# Patient Record
Sex: Female | Born: 1935 | Race: Black or African American | Hispanic: No
Health system: Southern US, Community
[De-identification: ages and names within clinical notes are randomized; demographics above are authoritative.]

---

## 2007-06-10 ENCOUNTER — Inpatient Hospital Stay (HOSPITAL_COMMUNITY): Admission: EM | Admit: 2007-06-10 | Discharge: 2007-06-17 | Payer: Self-pay | Admitting: Emergency Medicine

## 2007-06-10 ENCOUNTER — Ambulatory Visit: Payer: Self-pay | Admitting: Cardiology

## 2007-06-11 ENCOUNTER — Encounter (INDEPENDENT_AMBULATORY_CARE_PROVIDER_SITE_OTHER): Payer: Self-pay | Admitting: *Deleted

## 2007-06-11 ENCOUNTER — Ambulatory Visit: Payer: Self-pay | Admitting: Surgery

## 2007-08-05 ENCOUNTER — Other Ambulatory Visit: Payer: Self-pay

## 2007-08-05 ENCOUNTER — Emergency Department: Payer: Self-pay | Admitting: Unknown Physician Specialty

## 2007-11-09 ENCOUNTER — Emergency Department: Payer: Self-pay | Admitting: Emergency Medicine

## 2007-11-10 ENCOUNTER — Other Ambulatory Visit: Payer: Self-pay

## 2010-08-08 NOTE — Discharge Summary (Signed)
NAMEJANELYS, Heather Burgess               ACCOUNT NO.:  [id number]   MEDICAL RECORD NO.:  [id number]          PATIENT TYPE:  INP   LOCATION:  5504                         FACILITY:  [facility]   PHYSICIAN:  Wilson Singer, M.D.DATE OF BIRTH:  12-Mar-1925   DATE OF ADMISSION:  06/10/2007  DATE OF DISCHARGE:                               DISCHARGE SUMMARY   FINAL DISCHARGE DIAGNOSES:  1. Cerebrovascular disease, likely multi-infarct dementia.  2. Syncope, unclear etiology.  3. Depression.   MEDICATIONS ON DISCHARGE:  1. Paxil CR 25 mg daily.  2. Claritin 10 mg daily.  3. Multivitamins 1 tablet daily.  4. Protonix 40 mg daily.  5. Aspirin 325 mg daily.  6. Aricept 5 mg nightly.  7. Potassium chloride 20 mEq daily.  8. Tylenol 650 mg every 4 hours p.r.n.   CONDITION ON DISCHARGE:  Stable.   HISTORY:  This is an 77 year old lady who was admitted for a syncopal  episode.  Please see initial history and physical examination done by  Dr. Michaelyn Barter.   HOSPITAL PROGRESS:  The patient was admitted and the etiology of her  syncopal episode was not entirely clear clinically.  She underwent  bilateral carotid artery Dopplers, which were negative for any stenosis.  She also underwent echocardiogram, which did not reveal any  thromboembolic problems.  Her ejection fraction was 60-65% with mild  diastolic dysfunction.  She had an MRI of the brain, which showed 1 to 2  mm acute white matter infarctions probable in the right parietal and  left frontal lobes.  There was no large acute infarction.  The report  was questioning whether these were infarcts, but on a clinical basis,  they probably were.  During the course of the next few days, she has  stabilized and she has been reviewed by physical and occupational  therapy, who feel that, in terms of stability and safety, that she would  be better served being in a skilled nursing facility for a short-term  versus home health care.  The family  also wished for her to be in a  nursing home to improve her stability.  Now, we are awaiting a nursing  home bed to be found prior to discharge.   Further disposition once she has proven to be stable without any further  falls or syncopal episodes.  In the skilled nursing facility, the desire  would be for the patient to return in her own home to live with her son  and daughter-in-law.      Wilson Singer, M.D.  Electronically Signed     NCG/MEDQ  D:  06/16/2007  T:  06/16/2007  Job:  [id number]

## 2010-08-08 NOTE — H&P (Signed)
Heather Burgess, Heather Burgess NO.:  [id number]   MEDICAL RECORD NO.:  [id number]          PATIENT TYPE:  INP   LOCATION:  1825                         FACILITY:  [facility]   PHYSICIAN:  Michaelyn Barter, M.D. DATE OF BIRTH:  08/17/1924   DATE OF ADMISSION:  06/10/2007  DATE OF DISCHARGE:                              HISTORY & PHYSICAL   CHIEF COMPLAINT:  I fainted.   PRIMARY CARE PHYSICIAN:  Unassigned.   HISTORY OF PRESENT ILLNESS:  Heather Burgess is an 77 year old female who was  not sure of what happened to her.  She states that she passed out but  cannot recall the events that surrounded the episode. She has been  having what she describes as a faint headache over the last few days  that is usually localized to the right parietal area. Advil helps to  alleviate her symptoms. Her daughter-in-law, Heather Burgess, is also  at the bedside.  She states that the patient was asleep earlier this  morning on the second floor.  She herself was on the first floor.  She  heard a loud thud and ran upstairs to check on the patient. It appeared  that the patient have fallen out of bed. The patient was disoriented and  had difficulty talking shortly afterwards. The patient could not tell  her daughter what has happened, however, she complained of feeling  nauseated.  There have been no fevers or chills.  No shortness of  breath.  No chest pain.   PAST MEDICAL HISTORY:  Depression.   PAST SURGICAL HISTORY:  Left breast lumpectomy 10 years ago that was  benign.   ALLERGIES:  No known drug allergies.   HOME MEDICATIONS.1:  1. Hematinic Plus tablets once a daily.  2. Paxil CR 25 mg p.o. daily.  3. Evista 60 mg daily.  4. Zyrtec 10 mg p.o. daily.   SOCIAL HISTORY:  The patient stopped smoking cigarettes numerous years  ago.  Alcohol:  She stopped consuming alcohol at least 20 years ago.   FAMILY HISTORY:  Mother's medical history is unknown.  Father's medical  history is  unknown.   PHYSICAL EXAMINATION:  GENERAL:  The patient is awake.  She is  cooperative.  She is in no obvious respiratory distress.  VITAL SIGNS:  Temperature 97.8, blood pressure 142/75, heart rate 69,  respirations 14, O2 saturation 98%.  HEENT:  Normocephalic.  The patient has a small contusion on the lateral  aspect of her face just lateral to the left eyebrow.  She also has a  contusion on her left jaw. Oral mucosa is pink.  No thrush.  Dentures  are present within the upper and lower regions of the patient's mouth.  NECK:  Supple.  No JVD, no lymphadenopathy.  CARDIAC:  S1-S2 present.  Regular rate and rhythm.  RESPIRATORY:  No crackles or wheezes.  ABDOMEN:  Soft, some vague generalized discomfort.  No masses palpated.  Positive bowel sounds x4 quadrants.  EXTREMITIES:  No leg edema.  NEUROLOGIC:  The patient is alert and oriented x3.  Cranial nerves II-  XII are  intact.  MUSCULOSKELETAL:  5/5 upper and lower extremity strength.  There are no  obvious focal motor deficits.  There is no facial droop.  Negative  pronator drift.  Grip strength is symmetrically equal.   A chest x-ray reveals no acute findings.   CT scan of the patient's head also reveals no acute findings.   The patient's UA reveals 11-20 white blood cells, few bacteria, large  leukocytes, 5-6 wbc's.  The patient's white blood cell count is 5.6,  hemoglobin 14.4, hematocrit 43.0, platelets 192.  CK-MB at point of care  was less than 1.  Troponin I at point of care less than 0.05. Creatinine  1.2.  The pH was 7.378, pCO2 42.5, bicarb 25.0.  Sodium 143, potassium  3.7, chloride 111, glucose 84, BUN 16, creatinine 1.2.   ASSESSMENT AND PLAN:  1. Syncope. Whether or not this represents a true syncopal episode is      questionable. The patient's description of what led up to the      occurrence is very vague.  The patient does state that she passed      out.  However, whether or not the patient actually rolled out  of      bed and fell onto the floor versus having a true syncopal episode      is questionable.  Will look for cardiac versus noncardiac factors      that may have contributed to the patient's syncopal episode.  Will      check carotids and 2-D echocardiogram. Will cycle the patient's      cardiac markers. Will admit the patient for 23-hour observation.  2. Urinary tract infection.  Will start the patient on empiric IV      antibiotics.  3. Headache. The etiology of the patient's headache is questionable.      Whether or not this contributed to the syncopal episode is also      questionable. Will treat the patient symptomatically for now.  4. Gastrointestinal prophylaxis. Will provide Protonix.  5. Deep vein thrombosis prophylaxis.  Will provide Lovenox.      Michaelyn Barter, M.D.  Electronically Signed     OR/MEDQ  D:  06/10/2007  T:  06/10/2007  Job:  [id number]

## 2010-12-18 LAB — LIPID PANEL
Cholesterol: 194
HDL: 67
Triglycerides: 67

## 2010-12-18 LAB — URINE CULTURE
Colony Count: NO GROWTH
Culture: NO GROWTH

## 2010-12-18 LAB — COMPREHENSIVE METABOLIC PANEL
ALT: 16
ALT: 16
AST: 23
Albumin: 3.2 — ABNORMAL LOW
Alkaline Phosphatase: 102
CO2: 23
Chloride: 108
GFR calc Af Amer: 55 — ABNORMAL LOW
GFR calc non Af Amer: 46 — ABNORMAL LOW
Potassium: 3.3 — ABNORMAL LOW
Potassium: 3.3 — ABNORMAL LOW
Sodium: 137
Sodium: 139
Total Bilirubin: 0.9
Total Protein: 7.7

## 2010-12-18 LAB — CBC
HCT: 41.8
HCT: 43
Hemoglobin: 14.4
MCHC: 33.4
Platelets: 199
Platelets: 207
RBC: 4.86
RDW: 14
WBC: 6.3

## 2010-12-18 LAB — BASIC METABOLIC PANEL
BUN: 12
CO2: 25
Calcium: 8.8
Creatinine, Ser: 1.31 — ABNORMAL HIGH
GFR calc non Af Amer: 49 — ABNORMAL LOW
Glucose, Bld: 79
Potassium: 3.5
Sodium: 140

## 2010-12-18 LAB — DIFFERENTIAL
Eosinophils Relative: 2
Lymphocytes Relative: 32
Monocytes Absolute: 0.3
Monocytes Relative: 6
Neutro Abs: 3.4

## 2010-12-18 LAB — URINALYSIS, ROUTINE W REFLEX MICROSCOPIC
Bilirubin Urine: NEGATIVE
Hgb urine dipstick: NEGATIVE
Ketones, ur: NEGATIVE
Protein, ur: NEGATIVE
Urobilinogen, UA: 1

## 2010-12-18 LAB — I-STAT 8, (EC8 V) (CONVERTED LAB)
Chloride: 111
HCT: 47 — ABNORMAL HIGH
Hemoglobin: 16 — ABNORMAL HIGH
Potassium: 3.7
Sodium: 143
TCO2: 26

## 2010-12-18 LAB — CARDIAC PANEL(CRET KIN+CKTOT+MB+TROPI)
CK, MB: 0.9
Total CK: 53

## 2010-12-18 LAB — TROPONIN I: Troponin I: 0.03

## 2010-12-18 LAB — POCT I-STAT CREATININE: Operator id: 294521

## 2010-12-18 LAB — POCT CARDIAC MARKERS
CKMB, poc: <1 — ABNORMAL LOW
Troponin i, poc: <0.05

## 2010-12-18 LAB — URINE MICROSCOPIC-ADD ON

## 2011-02-08 ENCOUNTER — Emergency Department: Payer: Self-pay | Admitting: *Deleted

## 2011-09-26 ENCOUNTER — Emergency Department: Payer: Self-pay

## 2011-09-26 LAB — URINALYSIS, COMPLETE
Leukocyte Esterase: NEGATIVE
Nitrite: NEGATIVE
Ph: 7 (ref 4.5–8.0)
Protein: 30
RBC,UR: <1 /HPF (ref 0–5)

## 2011-09-26 LAB — COMPREHENSIVE METABOLIC PANEL
Albumin: 3.4 g/dL (ref 3.4–5.0)
Anion Gap: 6 — ABNORMAL LOW (ref 7–16)
Bilirubin,Total: 0.3 mg/dL (ref 0.2–1.0)
Calcium, Total: 9 mg/dL (ref 8.5–10.1)
Creatinine: 1.66 mg/dL — ABNORMAL HIGH (ref 0.60–1.30)
Glucose: 86 mg/dL (ref 65–99)
Osmolality: 284 (ref 275–301)
Potassium: 3.8 mmol/L (ref 3.5–5.1)
Sodium: 139 mmol/L (ref 136–145)
Total Protein: 8.4 g/dL — ABNORMAL HIGH (ref 6.4–8.2)

## 2011-09-26 LAB — CBC
HCT: 36.7 % (ref 35.0–47.0)
HGB: 11.9 g/dL — ABNORMAL LOW (ref 12.0–16.0)
MCH: 28.1 pg (ref 26.0–34.0)
MCHC: 32.4 g/dL (ref 32.0–36.0)
MCV: 87 fL (ref 80–100)
RBC: 4.24 10*6/uL (ref 3.80–5.20)

## 2011-11-08 ENCOUNTER — Emergency Department: Payer: Self-pay | Admitting: Emergency Medicine

## 2011-11-08 LAB — CBC
HCT: 35.7 % (ref 35.0–47.0)
MCH: 27.4 pg (ref 26.0–34.0)
MCV: 85 fL (ref 80–100)
Platelet: 308 10*3/uL (ref 150–440)
RDW: 14.5 % (ref 11.5–14.5)
WBC: 8.6 10*3/uL (ref 3.6–11.0)

## 2011-11-08 LAB — URINALYSIS, COMPLETE
Bilirubin,UR: NEGATIVE
Glucose,UR: NEGATIVE mg/dL (ref 0–75)
Ketone: NEGATIVE
RBC,UR: 1 /HPF (ref 0–5)

## 2011-11-08 LAB — COMPREHENSIVE METABOLIC PANEL
Alkaline Phosphatase: 101 U/L (ref 50–136)
Anion Gap: 6 — ABNORMAL LOW (ref 7–16)
Calcium, Total: 9.3 mg/dL (ref 8.5–10.1)
Co2: 27 mmol/L (ref 21–32)
EGFR (Non-African Amer.): 34 — ABNORMAL LOW
Glucose: 90 mg/dL (ref 65–99)
Osmolality: 284 (ref 275–301)
SGOT(AST): 27 U/L (ref 15–37)
Sodium: 139 mmol/L (ref 136–145)

## 2011-11-08 LAB — CK TOTAL AND CKMB (NOT AT ARMC): CK, Total: 41 U/L (ref 21–215)

## 2011-11-25 ENCOUNTER — Inpatient Hospital Stay: Payer: Self-pay | Admitting: Student

## 2011-11-25 LAB — URINALYSIS, COMPLETE
Bacteria: NONE SEEN
Bilirubin,UR: NEGATIVE
Glucose,UR: NEGATIVE mg/dL (ref 0–75)
Granular Cast: 3
Ketone: NEGATIVE
Nitrite: NEGATIVE
Specific Gravity: 1.014 (ref 1.003–1.030)
Squamous Epithelial: 1
WBC UR: 1 /HPF (ref 0–5)

## 2011-11-25 LAB — COMPREHENSIVE METABOLIC PANEL
Anion Gap: 12 (ref 7–16)
BUN: 29 mg/dL — ABNORMAL HIGH (ref 7–18)
Calcium, Total: 8.9 mg/dL (ref 8.5–10.1)
Chloride: 110 mmol/L — ABNORMAL HIGH (ref 98–107)
Co2: 21 mmol/L (ref 21–32)
EGFR (African American): 35 — ABNORMAL LOW
EGFR (Non-African Amer.): 30 — ABNORMAL LOW
Potassium: 3.4 mmol/L — ABNORMAL LOW (ref 3.5–5.1)
SGOT(AST): 30 U/L (ref 15–37)
SGPT (ALT): 18 U/L (ref 12–78)
Total Protein: 8.4 g/dL — ABNORMAL HIGH (ref 6.4–8.2)

## 2011-11-25 LAB — TROPONIN I: Troponin-I: 0.05 ng/mL

## 2011-11-25 LAB — CBC
Platelet: 294 10*3/uL (ref 150–440)
RDW: 14.5 % (ref 11.5–14.5)
WBC: 7.5 10*3/uL (ref 3.6–11.0)

## 2011-11-25 LAB — LIPASE, BLOOD: Lipase: 270 U/L (ref 73–393)

## 2011-11-26 LAB — BASIC METABOLIC PANEL
Calcium, Total: 8.9 mg/dL (ref 8.5–10.1)
Co2: 26 mmol/L (ref 21–32)
EGFR (African American): 43 — ABNORMAL LOW
Osmolality: 287 (ref 275–301)
Sodium: 142 mmol/L (ref 136–145)

## 2011-11-26 LAB — MAGNESIUM: Magnesium: 2 mg/dL

## 2011-11-26 LAB — LIPID PANEL
HDL Cholesterol: 65 mg/dL — ABNORMAL HIGH (ref 40–60)
Triglycerides: 91 mg/dL (ref 0–200)

## 2012-04-27 ENCOUNTER — Inpatient Hospital Stay: Payer: Self-pay | Admitting: Internal Medicine

## 2012-04-27 LAB — URINALYSIS, COMPLETE
Bacteria: NONE SEEN
Bilirubin,UR: NEGATIVE
Blood: NEGATIVE
Glucose,UR: NEGATIVE mg/dL (ref 0–75)
Ketone: NEGATIVE
Leukocyte Esterase: NEGATIVE
Nitrite: NEGATIVE
Protein: NEGATIVE
Specific Gravity: 1.01 (ref 1.003–1.030)
Squamous Epithelial: NONE SEEN
WBC UR: <1 /HPF (ref 0–5)

## 2012-04-27 LAB — COMPREHENSIVE METABOLIC PANEL
Albumin: 3.2 g/dL — ABNORMAL LOW (ref 3.4–5.0)
Alkaline Phosphatase: 129 U/L (ref 50–136)
Anion Gap: 9 (ref 7–16)
Calcium, Total: 8.6 mg/dL (ref 8.5–10.1)
Chloride: 108 mmol/L — ABNORMAL HIGH (ref 98–107)
Co2: 23 mmol/L (ref 21–32)
Osmolality: 302 (ref 275–301)
Potassium: 3.9 mmol/L (ref 3.5–5.1)
SGOT(AST): 28 U/L (ref 15–37)
SGPT (ALT): 22 U/L (ref 12–78)
Sodium: 140 mmol/L (ref 136–145)
Total Protein: 8.5 g/dL — ABNORMAL HIGH (ref 6.4–8.2)

## 2012-04-27 LAB — TROPONIN I
Troponin-I: 0.06 ng/mL — ABNORMAL HIGH
Troponin-I: 0.05 ng/mL

## 2012-04-27 LAB — CBC WITH DIFFERENTIAL/PLATELET
Basophil #: 0 10*3/uL (ref 0.0–0.1)
Eosinophil %: 3.2 %
HCT: 28.5 % — ABNORMAL LOW (ref 35.0–47.0)
HGB: 8.9 g/dL — ABNORMAL LOW (ref 12.0–16.0)
Lymphocyte #: 2.1 10*3/uL (ref 1.0–3.6)
Lymphocyte %: 35.3 %
MCHC: 31.4 g/dL — ABNORMAL LOW (ref 32.0–36.0)
Monocyte %: 3.2 %
WBC: 6 10*3/uL (ref 3.6–11.0)

## 2012-04-27 LAB — MAGNESIUM: Magnesium: 2.2 mg/dL

## 2012-04-28 LAB — BASIC METABOLIC PANEL
Chloride: 105 mmol/L (ref 98–107)
Co2: 25 mmol/L (ref 21–32)
Creatinine: 1.14 mg/dL (ref 0.60–1.30)
EGFR (Non-African Amer.): 44 — ABNORMAL LOW
Potassium: 4 mmol/L (ref 3.5–5.1)
Sodium: 139 mmol/L (ref 136–145)

## 2012-04-28 LAB — CBC WITH DIFFERENTIAL/PLATELET
Basophil #: 0 10*3/uL (ref 0.0–0.1)
Eosinophil #: 0.4 10*3/uL (ref 0.0–0.7)
Lymphocyte %: 26 %
MCV: 81 fL (ref 80–100)
Monocyte #: 0.3 x10 3/mm (ref 0.2–0.9)
Neutrophil #: 4.8 10*3/uL (ref 1.4–6.5)
RDW: 15.8 % — ABNORMAL HIGH (ref 11.5–14.5)
WBC: 7.6 10*3/uL (ref 3.6–11.0)

## 2012-05-03 LAB — CULTURE, BLOOD (SINGLE)

## 2012-06-29 ENCOUNTER — Emergency Department: Payer: Self-pay | Admitting: Emergency Medicine

## 2012-06-29 LAB — CBC
MCHC: 31.5 g/dL — ABNORMAL LOW (ref 32.0–36.0)
MCV: 79 fL — ABNORMAL LOW (ref 80–100)
RBC: 4.7 10*6/uL (ref 3.80–5.20)
RDW: 16.7 % — ABNORMAL HIGH (ref 11.5–14.5)
WBC: 8.1 10*3/uL (ref 3.6–11.0)

## 2012-06-29 LAB — COMPREHENSIVE METABOLIC PANEL
Alkaline Phosphatase: 121 U/L (ref 50–136)
Anion Gap: 2 — ABNORMAL LOW (ref 7–16)
Bilirubin,Total: 0.4 mg/dL (ref 0.2–1.0)
Calcium, Total: 9 mg/dL (ref 8.5–10.1)
Chloride: 108 mmol/L — ABNORMAL HIGH (ref 98–107)
Sodium: 138 mmol/L (ref 136–145)

## 2013-03-07 ENCOUNTER — Inpatient Hospital Stay: Payer: Self-pay | Admitting: Internal Medicine

## 2013-03-07 LAB — URINALYSIS, COMPLETE
Glucose,UR: NEGATIVE mg/dL (ref 0–75)
Granular Cast: 7
Hyaline Cast: 15
Ph: 5 (ref 4.5–8.0)
RBC,UR: 4 /HPF (ref 0–5)
Specific Gravity: 1.028 (ref 1.003–1.030)
Squamous Epithelial: 1
WBC UR: 10 /HPF (ref 0–5)

## 2013-03-07 LAB — CBC
HCT: 34.5 % — ABNORMAL LOW (ref 35.0–47.0)
HGB: 10.7 g/dL — ABNORMAL LOW (ref 12.0–16.0)
Platelet: 262 10*3/uL (ref 150–440)
RBC: 4.48 10*6/uL (ref 3.80–5.20)

## 2013-03-07 LAB — COMPREHENSIVE METABOLIC PANEL
Calcium, Total: 8.9 mg/dL (ref 8.5–10.1)
Chloride: 109 mmol/L — ABNORMAL HIGH (ref 98–107)
Co2: 24 mmol/L (ref 21–32)
Creatinine: 2.21 mg/dL — ABNORMAL HIGH (ref 0.60–1.30)
EGFR (Non-African Amer.): 20 — ABNORMAL LOW
Glucose: 110 mg/dL — ABNORMAL HIGH (ref 65–99)
SGOT(AST): 34 U/L (ref 15–37)
Sodium: 138 mmol/L (ref 136–145)

## 2013-03-08 LAB — BASIC METABOLIC PANEL
BUN: 35 mg/dL — ABNORMAL HIGH (ref 7–18)
Chloride: 113 mmol/L — ABNORMAL HIGH (ref 98–107)
Co2: 21 mmol/L (ref 21–32)
EGFR (Non-African Amer.): 21 — ABNORMAL LOW
Osmolality: 287 (ref 275–301)

## 2013-03-08 LAB — CBC WITH DIFFERENTIAL/PLATELET
Basophil #: 0.1 10*3/uL (ref 0.0–0.1)
HGB: 10.7 g/dL — ABNORMAL LOW (ref 12.0–16.0)
Lymphocyte #: 1.6 10*3/uL (ref 1.0–3.6)
MCH: 24.9 pg — ABNORMAL LOW (ref 26.0–34.0)
MCHC: 31.8 g/dL — ABNORMAL LOW (ref 32.0–36.0)
MCV: 78 fL — ABNORMAL LOW (ref 80–100)
RBC: 4.31 10*6/uL (ref 3.80–5.20)
WBC: 9.4 10*3/uL (ref 3.6–11.0)

## 2013-03-10 LAB — BASIC METABOLIC PANEL
Co2: 24 mmol/L (ref 21–32)
EGFR (African American): 43 — ABNORMAL LOW
EGFR (Non-African Amer.): 37 — ABNORMAL LOW
Sodium: 139 mmol/L (ref 136–145)

## 2013-03-12 LAB — CULTURE, BLOOD (SINGLE)

## 2013-03-26 ENCOUNTER — Ambulatory Visit: Payer: Self-pay | Admitting: Nurse Practitioner

## 2013-04-23 ENCOUNTER — Observation Stay: Payer: Self-pay | Admitting: Specialist

## 2013-04-23 LAB — COMPREHENSIVE METABOLIC PANEL
ALBUMIN: 3.3 g/dL — AB (ref 3.4–5.0)
ALK PHOS: 114 U/L
AST: 53 U/L — AB (ref 15–37)
Anion Gap: 6 — ABNORMAL LOW (ref 7–16)
BILIRUBIN TOTAL: 0.2 mg/dL (ref 0.2–1.0)
BUN: 26 mg/dL — ABNORMAL HIGH (ref 7–18)
CALCIUM: 9.5 mg/dL (ref 8.5–10.1)
CO2: 20 mmol/L — AB (ref 21–32)
Chloride: 111 mmol/L — ABNORMAL HIGH (ref 98–107)
Creatinine: 1.72 mg/dL — ABNORMAL HIGH (ref 0.60–1.30)
EGFR (African American): 31 — ABNORMAL LOW
GFR CALC NON AF AMER: 26 — AB
Glucose: 68 mg/dL (ref 65–99)
Osmolality: 277 (ref 275–301)
Potassium: 4.8 mmol/L (ref 3.5–5.1)
SGPT (ALT): 27 U/L (ref 12–78)
Sodium: 137 mmol/L (ref 136–145)
TOTAL PROTEIN: 9.3 g/dL — AB (ref 6.4–8.2)

## 2013-04-23 LAB — URINALYSIS, COMPLETE
BILIRUBIN, UR: NEGATIVE
BLOOD: NEGATIVE
Glucose,UR: NEGATIVE mg/dL (ref 0–75)
Ketone: NEGATIVE
Nitrite: NEGATIVE
PH: 5 (ref 4.5–8.0)
Protein: 30
RBC,UR: 2 /HPF (ref 0–5)
Specific Gravity: 1.026 (ref 1.003–1.030)
WBC UR: 22 /HPF (ref 0–5)

## 2013-04-23 LAB — TROPONIN I
Troponin-I: 0.06 ng/mL — ABNORMAL HIGH
Troponin-I: 0.07 ng/mL — ABNORMAL HIGH
Troponin-I: 0.06 ng/mL — ABNORMAL HIGH
Troponin-I: 0.06 ng/mL — ABNORMAL HIGH

## 2013-04-23 LAB — CBC
HCT: 34.8 % — ABNORMAL LOW (ref 35.0–47.0)
HGB: 11.2 g/dL — AB (ref 12.0–16.0)
MCH: 25.7 pg — ABNORMAL LOW (ref 26.0–34.0)
MCHC: 32.1 g/dL (ref 32.0–36.0)
MCV: 80 fL (ref 80–100)
Platelet: 243 10*3/uL (ref 150–440)
RBC: 4.35 10*6/uL (ref 3.80–5.20)
RDW: 19.5 % — AB (ref 11.5–14.5)
WBC: 7.9 10*3/uL (ref 3.6–11.0)

## 2013-04-23 LAB — LIPASE, BLOOD: Lipase: 291 U/L (ref 73–393)

## 2013-04-23 LAB — CK TOTAL AND CKMB (NOT AT ARMC)
CK, TOTAL: 32 U/L (ref 21–215)
CK, Total: 26 U/L (ref 21–215)
CK, Total: 33 U/L (ref 21–215)
CK-MB: 1 ng/mL (ref 0.5–3.6)
CK-MB: 0.9 ng/mL (ref 0.5–3.6)
CK-MB: 0.8 ng/mL (ref 0.5–3.6)

## 2013-04-24 DIAGNOSIS — I059 Rheumatic mitral valve disease, unspecified: Secondary | ICD-10-CM

## 2013-04-24 LAB — URINE CULTURE

## 2013-04-24 LAB — BASIC METABOLIC PANEL
Anion Gap: 5 — ABNORMAL LOW (ref 7–16)
BUN: 16 mg/dL (ref 7–18)
CALCIUM: 9.1 mg/dL (ref 8.5–10.1)
CO2: 21 mmol/L (ref 21–32)
Chloride: 112 mmol/L — ABNORMAL HIGH (ref 98–107)
Creatinine: 1.5 mg/dL — ABNORMAL HIGH (ref 0.60–1.30)
EGFR (Non-African Amer.): 31 — ABNORMAL LOW
GFR CALC AF AMER: 36 — AB
GLUCOSE: 76 mg/dL (ref 65–99)
Osmolality: 276 (ref 275–301)
POTASSIUM: 4.1 mmol/L (ref 3.5–5.1)
SODIUM: 138 mmol/L (ref 136–145)

## 2013-04-24 LAB — CBC WITH DIFFERENTIAL/PLATELET
Basophil #: 0 10*3/uL (ref 0.0–0.1)
Basophil %: 0.6 %
EOS ABS: 0.1 10*3/uL (ref 0.0–0.7)
EOS PCT: 1.8 %
HCT: 33.7 % — ABNORMAL LOW (ref 35.0–47.0)
HGB: 10.9 g/dL — AB (ref 12.0–16.0)
Lymphocyte #: 2.5 10*3/uL (ref 1.0–3.6)
Lymphocyte %: 35.7 %
MCH: 25.6 pg — ABNORMAL LOW (ref 26.0–34.0)
MCHC: 32.5 g/dL (ref 32.0–36.0)
MCV: 79 fL — AB (ref 80–100)
MONOS PCT: 4.4 %
Monocyte #: 0.3 x10 3/mm (ref 0.2–0.9)
NEUTROS PCT: 57.5 %
Neutrophil #: 4 10*3/uL (ref 1.4–6.5)
Platelet: 252 10*3/uL (ref 150–440)
RBC: 4.28 10*6/uL (ref 3.80–5.20)
RDW: 19.6 % — ABNORMAL HIGH (ref 11.5–14.5)
WBC: 7 10*3/uL (ref 3.6–11.0)

## 2013-04-26 LAB — BASIC METABOLIC PANEL
ANION GAP: 8 (ref 7–16)
BUN: 12 mg/dL (ref 7–18)
CALCIUM: 8.8 mg/dL (ref 8.5–10.1)
CO2: 20 mmol/L — AB (ref 21–32)
Chloride: 112 mmol/L — ABNORMAL HIGH (ref 98–107)
Creatinine: 1.23 mg/dL (ref 0.60–1.30)
EGFR (African American): 46 — ABNORMAL LOW
EGFR (Non-African Amer.): 40 — ABNORMAL LOW
Glucose: 65 mg/dL (ref 65–99)
OSMOLALITY: 277 (ref 275–301)
Potassium: 3.6 mmol/L (ref 3.5–5.1)
Sodium: 140 mmol/L (ref 136–145)

## 2014-04-26 DEATH — deceased

## 2014-05-19 IMAGING — CT CT HEAD WITHOUT CONTRAST
3 series · 17 of 30 positions shown, 19 images · non-contrast
Comparison: CT HEAD W/O CM dated 06/29/2012; CT HEAD W/O CM dated
11/25/2011

CLINICAL DATA: Unresponsive.

EXAM:
CT HEAD WITHOUT CONTRAST
TECHNIQUE: Contiguous axial images were obtained from the base of the skull
through the vertex without intravenous contrast.

[Series 2: soft tissue · axial · 0.37mm/px · z∈[+338,+454]mm · 7 of 31 slices shown (1 of 2)]
[im 4/31  brain]
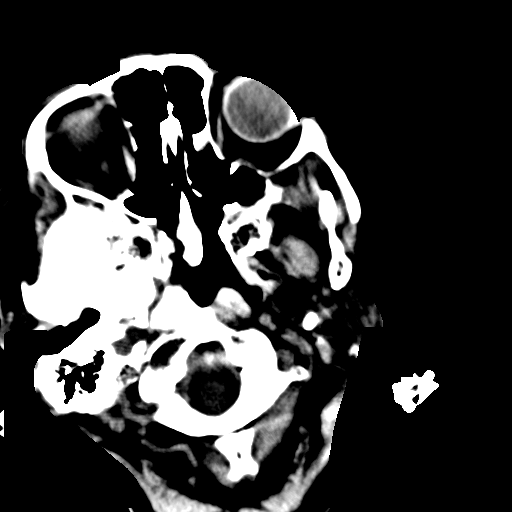
[im 8/31  brain]
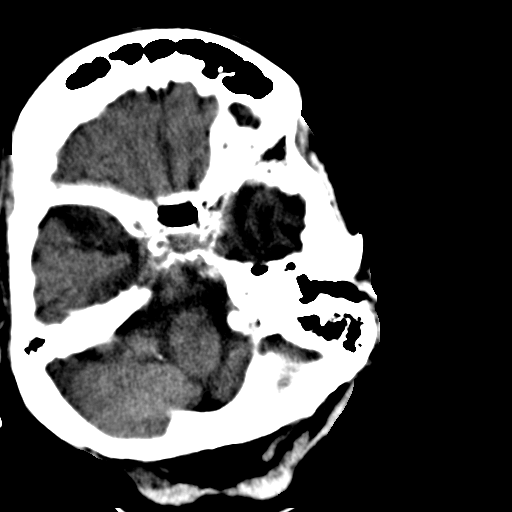
[im 12/31  brain]
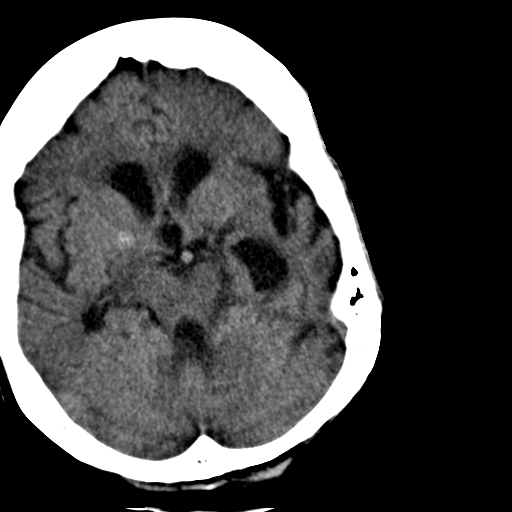
[im 16/31  brain]
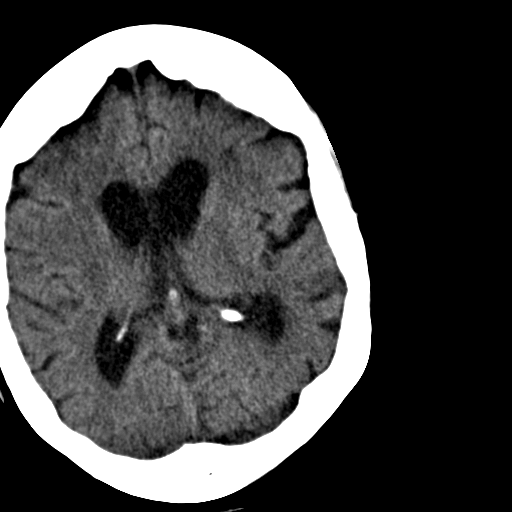
[im 19/31  brain]
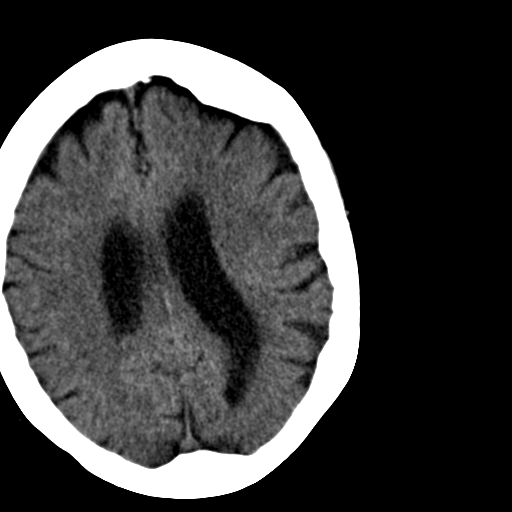
[im 23/31  brain]
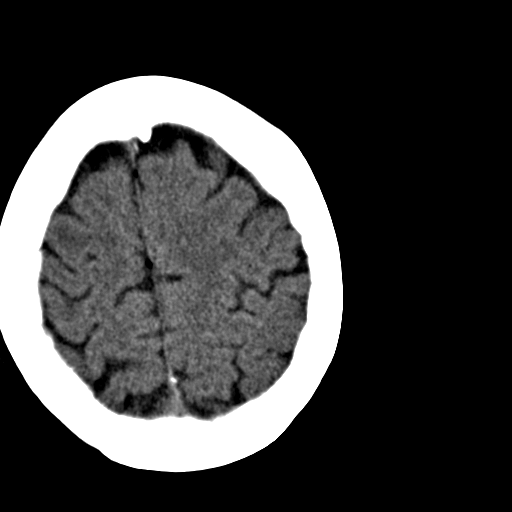
[im 27/31  brain]
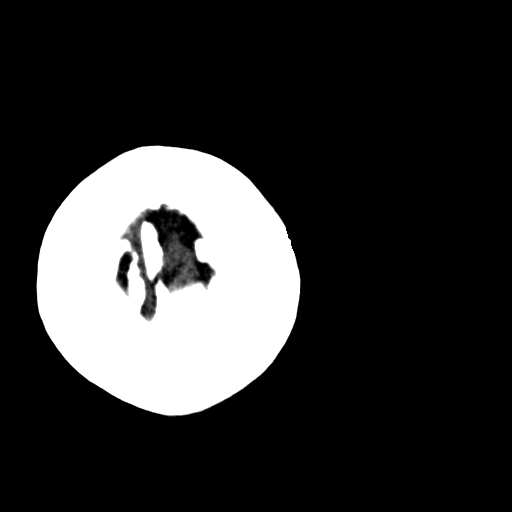

[Series 4: soft tissue · axial · 0.37mm/px · z∈[+327,+442]mm · 8 of 31 slices shown, 10 images (2 of 2)]
[im 4/31  brain]
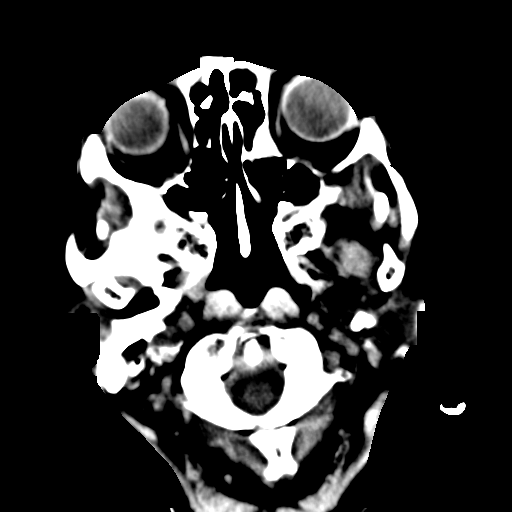
[im 4/31  bone]
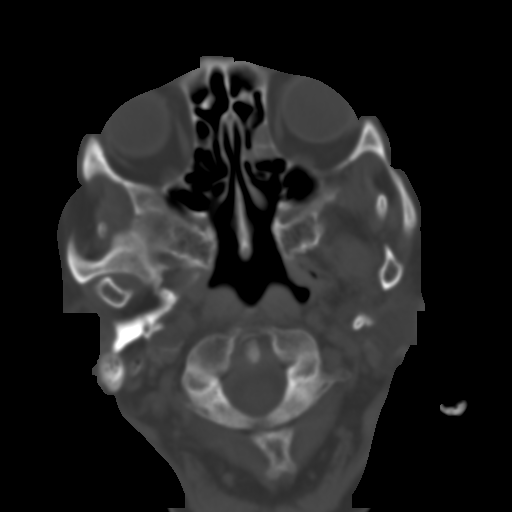
[im 7/31  brain]
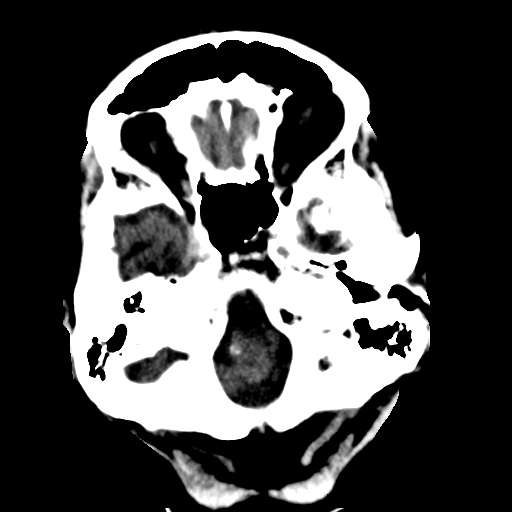
[im 11/31  brain]
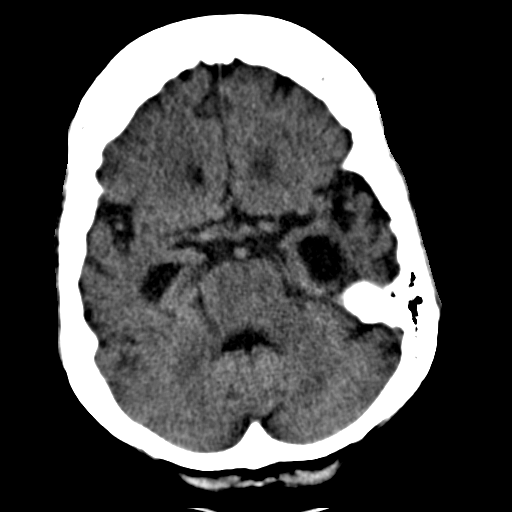
[im 14/31  brain]
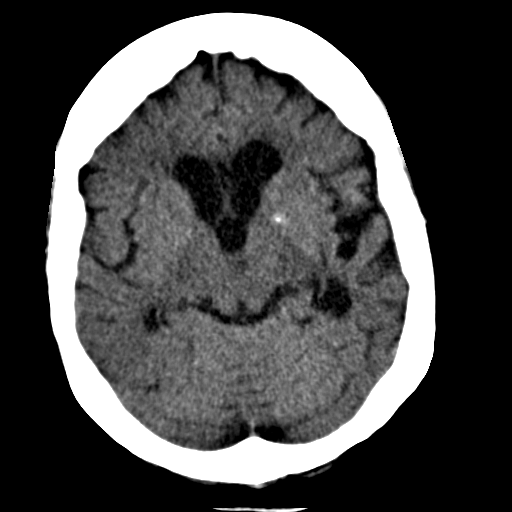
[im 17/31  brain]
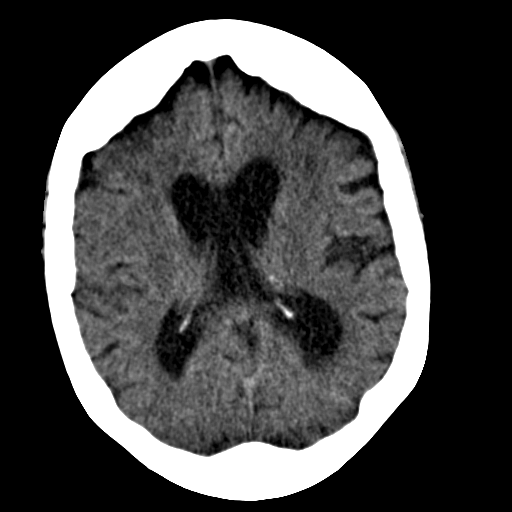
[im 17/31  bone]
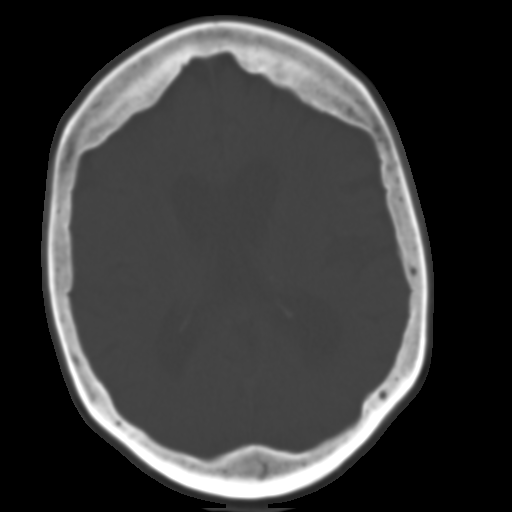
[im 21/31  brain]
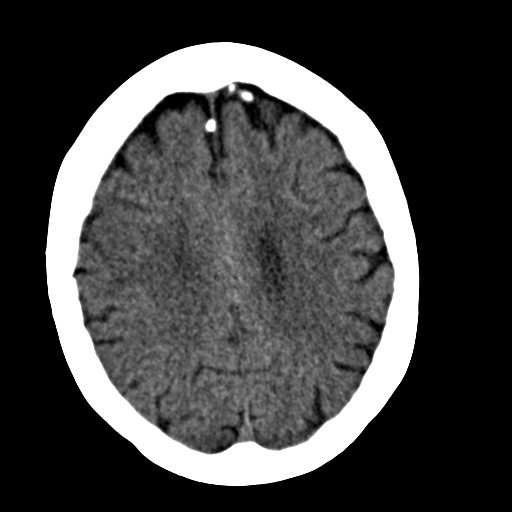
[im 24/31  brain]
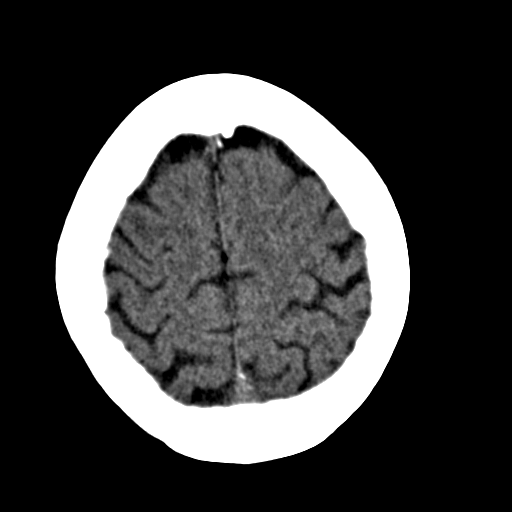
[im 27/31  brain]
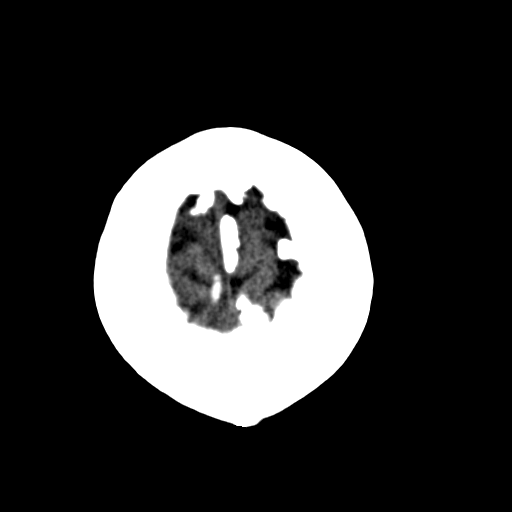

[Series 5: bone · axial · 0.37mm/px · z∈[+318,+333]mm · 2 of 34 slices shown]
[im 4/34  bone]
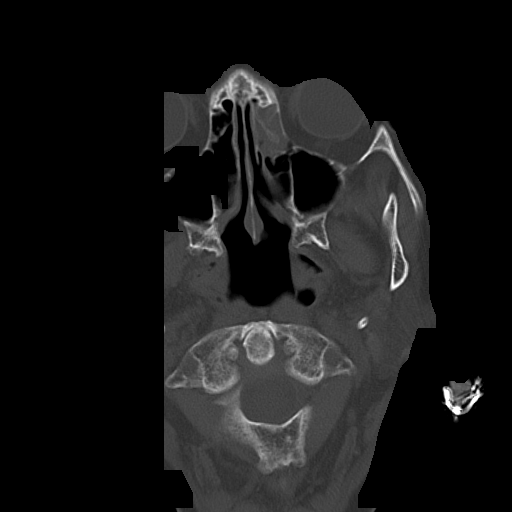
[im 7/34  bone]
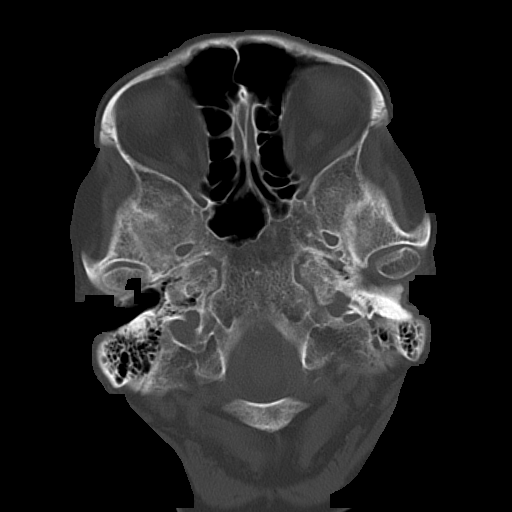

[17 of 30 positions shown; findings below may reference images not displayed]

FINDINGS: Bilateral basal ganglia calcifications again noted. The basal
ganglia calcification on the right is slightly more prominent on
today's examination. To assure that no associated subtle hemorrhage
is present, a follow-up CT in 24 hrs is suggested. Diffuse atrophy.
Ventriculomegaly noted consistent with degree of atrophy. A process
such as normal pressure hydrocephalus cannot be excluded. Similar
findings noted on prior exam. Cerebral vascular calcification.
Mucosal thickening of the left ethmoidal sinuses. Mastoids are
clear. Soft tissue densities in the external auditory canals most
likely represents cerumen.
IMPRESSION: 1. Bilateral basal ganglia calcifications again noted. Basal ganglia
calcification or right is slightly more prominent today's
examination. To assure that there is no associated subtle hemorrhage
in this region a follow-up CT in 24 hr is suggested.
2. No acute abnormality.  Diffuse atrophy.
3. Cerebral vascular disease.
4. Mucosal thickening of the left ethmoidal sinuses.
These results will be called to the ordering clinician or
representative by the Radiologist Assistant, and communication
documented in the PACS Dashboard.

## 2014-07-13 NOTE — H&P (Signed)
PATIENT NAME:  Heather Burgess, Heather Burgess MR#:  [id number] DATE OF BIRTH:  [birth year]  DATE OF ADMISSION:  11/25/2011  PRIMARY CARE PHYSICIAN: None local.  REFERRING PHYSICIAN: Maricela Bo, MD  CHIEF COMPLAINT: Unresponsiveness today.   HISTORY OF PRESENT ILLNESS: The patient is an 81 year old African American female with a history of hypertension, hyperlipidemia, GERD, and dementia who presented to the ED with unresponsiveness today. The patient is awake but confused and cannot provide any information. According to Dr. Clemens Catholic, the patient was brought from her nursing home and was found unresponsive. While eating breakfast, she stood up and then fell to the right side with eyes rolling back. The patient also had vomiting and diaphoresis. In the ED, blood pressure was in the 60s with heart rate 40s. She was treated with IV fluid and blood pressure increased to normal range.   PAST MEDICAL HISTORY:  1. Hypertension. 2. Hyperlipidemia. 3. Gastroesophageal reflux disease. 4. Dementia.   SOCIAL HISTORY: Unknown.  FAMILY HISTORY: Unknown.  REVIEW OF SYSTEMS: Unable to obtain at this time.   ALLERGIES: No known drug allergies.   MEDICATIONS:  1. Aspirin 81 mg p.o. daily. 2. Bisac-Evac 10 mg rectal suppository once a day p.r.n. for constipation.  3. Cymbalta 60 mg p.o. once daily.  4. Colace 100 mg p.o. twice a day for constipation.  5. Donepezil 10 mg p.o. daily. 6. HCTZ 25 mg p.o. daily.  7. Mapap 500 mg p.o. tablets one tablet twice a day. 8. Milk of Magnesia 30 mL once daily p.r.n.  9. Mirtazapine 50 mg p.o. 1/2 tablet at bedtime.  10. Namenda 5 mg p.o. twice a day.  11. Omeprazole 20 mg p.o. daily.  12. Senna Lax 8.6 mg p.o. tablets two tablets at bedtime.   PHYSICAL EXAMINATION:   VITAL SIGNS: Temperature 97.6, blood pressure 136/89, pulse 78, respirations 20, and oxygen saturation 94%.   GENERAL: The patient is awake but demented and confused, in no acute distress.   HEENT: Pupils  are round and equal, about 3 mm in diameter, not reactive to light but accommodation. Moist oral mucosa. Clear oropharynx. No discharge from nose or ears.   NECK: Supple. No JVD or carotid bruit. No lymphadenopathy. No thyromegaly.   CARDIOVASCULAR: S1 and S2 regular rate and rhythm. No murmurs or gallops.   PULMONARY: Bilateral air entry. No wheezing or rales.   ABDOMEN: Soft. No distention. No tenderness. No organomegaly. Bowel sounds present.   EXTREMITIES: No edema, clubbing, or cyanosis. No calf tenderness. Power 5/5. Sensation intact.   SKIN: No rash or jaundice.   NEUROLOGIC: The patient is awake but demented and confused, cooperative. No focal deficit. Sensation intact. Deep tendon reflexes mute.   LABORATORY, DIAGNOSTIC AND RADIOLOGIC DATA: Urinalysis is negative.   ABG shows a pH of 7.56, CO2 24, and pO2 108.  WBC 7.5, hemoglobin 11.9, and platelets 294. Glucose 112, BUN 29, creatinine 1.55, potassium 3.4, sodium 143, chloride 110, and bicarbonate 21. Lipase 270. CK 43 and troponin 0.05.  CAT scan of the head showed involutional changes without evidence of focal or acute abnormality.   Chest x-ray showed shallow inspiration without evidence of acute cardiopulmonary disease.   EKG showed normal sinus rhythm at 78 beats per minute.   IMPRESSION:  1. Altered mental status of unclear etiology possibly due to hypotension. 2. Hypotension, resolved.  3. Bradycardia possibly due to vasovagal reaction and  vomiting. 4. Acute renal failure with dehydration.  5. Hypertension, controlled.  6. Dementia.  7. Gastroesophageal reflux disease.  PLAN OF TREATMENT: The patient will be admitted to the telemetry floor. We will start IV fluid, follow up BMP, and hold HCTZ. We will get a carotid duplex. We will continue Namenda and Donepezil for dementia. GI and DVT prophylaxis. The patient has DNR status.  TIME SPENT: About 65 minutes.  ____________________________ Shaune PollackQing Stephaney Steven,  MD qc:slb D: 11/25/2011 16:18:35 ET T: 11/26/2011 09:04:30 ET JOB#: [id number]  cc: Shaune PollackQing Lillyona Polasek, MD, <Dictator> Shaune PollackQING Summerlyn Fickel MD ELECTRONICALLY SIGNED 11/26/2011 14:10

## 2014-07-13 NOTE — Discharge Summary (Signed)
PATIENT NAME:  Heather Burgess, Heather Burgess MR#:  [id number] DATE OF BIRTH:  November 20, 1924  DATE OF ADMISSION:  11/25/2011 DATE OF DISCHARGE:  11/28/2011  CHIEF COMPLAINT: Syncope.   DISCHARGE DIAGNOSES:  1. Syncope, likely vasovagal in nature. 2. Transient bradycardia. 3. Transient hypotension. 4. Advanced dementia.  5. Chronic kidney disease, likely stage III.  6. Hypokalemia.  7. History of gastroesophageal reflux disease. 8. History of hyperlipidemia.   DISCHARGE MEDICATIONS:  1. Aspirin 81 mg daily.  2. Cymbalta 60 mg extended-release daily.  3. Omeprazole 20 mg daily.  4. Donepezil 10 mg daily.  5. MAPAP 500 mg 1 tab 2 times a day.  6. Namenda 5 mg 2 times a day.  7. Mirtazapine 7.5 mg at bedtime. 8. Senna laxative 8.6 mg, 2 tabs once a day at bedtime. 9. Bisac-Evac 10-mg rectal suppository once a day as needed for constipation.  10. Milk of Magnesia 8% oral solution, 30 mL once a day as needed for constipation.  11. Metoprolol 25 mg, 1 tab 2 times a day.   DIET: Low sodium with no added salt with Ensure b.i.d.   ACTIVITY: As tolerated with rolling walker.   FOLLOWUP: Please follow up with primary care physician within 1 to 2 weeks for vitals check.   DISPOSITION:  The patient will be going back to her SNF with Hospice care resumption.   HISTORY OF PRESENT ILLNESS: For full details of History and Physical, please see the dictation of 11/25/2011 by Dr. Imogene Burnhen. Briefly, this is an 81 year old African American female with history of hypertension, hyperlipidemia, gastroesophageal reflux disease, and advanced dementia who presented with an episode of unresponsiveness. She was eating breakfast and stood up and fell to the right side with her eyes rolling back and was noted to be vomiting and diaphoretic. Here she had transient bradycardia with hypotension with systolic blood pressure in the 60s. She was treated with IV fluids and given a dose of atropine with resolution of the bradycardia and  hypotension and was therefore admitted to the hospitalist service for monitoring.   LABORATORY DATA:  Initial creatinine was 1.55. On 09/03 it was 1.31. Sodium was 143. Potassium was 4.3, chloride 110 on arrival. LFTs: Albumin 3.3, total protein of 8.4, otherwise within normal limits. LDL of 98. Troponin negative times one. WBC 7.5, hemoglobin 11.9, hematocrit 36.3, platelets are 294. Urinalysis not suggestive of infection. ABG showing pH of 7.56, pCO2 24, pO2 of 108. CT of the head without contrast showing involutional changes without evidence of focal or acute abnormalities. X-ray of the chest, one view, showing shallow inspiration without evidence of acute cardiopulmonary disease. Carotid ultrasound showing no significant stenosis.   HOSPITAL COURSE: The patient was admitted to the hospitalist service on telemetry with remote monitoring. The patient likely had a vasovagal syncope evidenced by diaphoresis, vomiting, and transient hypotension and bradycardia. She was started on some gentle IV fluids and blood pressure responded appropriately. She has been off of IV fluids since. The patient also did receive a dose of atropine. The patient was placed on telemetry. However, no significant arrhythmia was found and the patient kept taking off the leads and therefore that was discontinued. The patient has advanced dementia and per family this is her baseline and she cannot  recognize her children any longer. The patient also did have mild renal failure that I think is more chronic and in line with her baseline. Of note, the patient had a BMP done on August 15 of this year with creatinine of  1.4 and prior to that in July creatinine was 1.6; so therefore the initial creatinine was at her baseline. She had a negative troponin on arrival as well. She had a CT of the head which was negative for acute events.  Although initially she did not comply with finishing the ultrasound of the carotids, it was later completed on a  different day and showed no significant stenosis. Her hydrochlorothiazide was changed to metoprolol and her blood pressure remained stable. Sometimes HCTZ can cause hypokalemia as well as orthostatic hypotension or dizziness. Her blood pressure has remained stable after the initial insult.  She will be going back to her facility with Hospice care resumption. The case was discussed with the patient's son/POA  prior.   CODE STATUS: The patient is DO NOT RESUSCITATE.   TOTAL TIME SPENT: 35 minutes.    ____________________________ Krystal Eaton, MD sa:bjt D: 11/28/2011 15:38:04 ET T: 11/28/2011 15:54:09 ET JOB#: [id number]  cc: Krystal Eaton, MD, <Dictator> Krystal Eaton MD ELECTRONICALLY SIGNED 12/05/2011 0:23

## 2014-07-16 NOTE — H&P (Signed)
PATIENT NAME:  Heather Burgess, Heather Burgess MR#:  [id number] DATE OF BIRTH:  [birth year]  DATE OF ADMISSION:  04/27/2012  The patient is from [facility].   CHIEF COMPLAINT: Altered mental status.   HISTORY OF PRESENT ILLNESS: The patient is an 81 year old female with advanced dementia brought in from the [facility] because of altered mental status. The patient had a fall this morning. The patient is more confused than usual and so she was brought in here.  When the patient arrived, her blood sugars were around 55, so they gave D50 in the emergency room and also found to have elevated blood pressure, systolic more than 200. The patient is going to be admitted for altered mental status, fall, hypoglycemia, and hypertensive urgency. The patient was seen in the emergency room. The patient is awake but disoriented, unable to tell where she is and where she came from and complains of pain in the left hand around the IV site but unable to give me any other complaints.   PAST MEDICAL HISTORY: Significant for severe dementia, osteoarthritis, depression,  History of gastroesophageal reflux disease, hypertension, history of breast cancer, and diverticulitis.   ALLERGIES: No known allergies.   SOCIAL HISTORY: Unable to obtain because of her dementia; and she lives at the [facility].   FAMILY HISTORY: Unable to obtain due to dementia, and I do not see any family members.   MEDICATIONS: According to the nursing home records, she is on:  1.  Senna 2 tablets daily.  2.  Namenda XR 40 mg daily.  3.  Omeprazole 20 mg daily.  4.  Mirtazapine 15 mg takes half tablet daily.  5.  Metoprolol 25 mg p.o. b.i.d. 6.   7.  Lorazepam 0.5 mg every six hours p.r.n. for agitation.  8.  Ensure.  9.  Aricept 10 mg daily.  10.  Cymbalta 60 mg daily.  11.  dulocolax  for constipation 10 mg suppository as needed.  12.  Aspirin 81 mg daily   CODE STATUS: DO NOT RESUSCITATE.   review of systems: Unable to obtain  because of dementia.   PHYSICAL EXAMINATION:  VITAL SIGNS: Initially, when she came, blood pressure was 208/98, pulse 70, O2 sats 91% on room air. The patient's temperature was within normal limits.   HEAD: Atraumatic, normocephalic. Pupils equally reacting to light. Extraocular movements intact.  EARS, NOSE AND THROAT: No tympanic membrane, congestion. No turbinate hypertrophy. No oropharyngeal erythema.  NECK: Normal range of motion. No JVD. No carotid bruit.  CARDIOVASCULAR: S1, S2 regular. No murmurs.  LUNGS: Clear to auscultation. No wheeze. No rales.  ABDOMEN: Soft, nontender, nondistended. Bowel sounds present.  EXTREMITIES: No extremity edema. No cyanosis. No clubbing. The patient has no obvious deformity. PSYCHIATRIC: The patient is disoriented and has severe dementia.   LABORATORY DATA: The patient electrolytes: Sodium is 140, potassium 3.9, chloride 108, bicarbonate 23, BUN 423, creatinine 1.02. Glucose is 430. The patient's lipase 292. WBC is 6,  hemoglobin 8.9, hematocrit 28.3, platelets 228.   CT of the cervical spine showed bilateral emphysematous changes with apical scarring. The patient also found to have no osseous abnormality.   CT head showed no acute intracranial process.  Chest x-ray shows bilateral diffuse interstitial thickening representing edema versus interstitial pneumonitis.   Troponin 0.06, magnesium 2.2. Urinalysis is clear.   EKG showed normal sinus rhythm with 68 beats per minute. No ST-T changes.   ASSESSMENT AND PLAN:  1.  The patient is an 81 year old female patient with altered  mental status likely due to worsening underlying dementia causing fall but also differential could be malignant hypertension causing hypertensive encephalopathy. The patient did receive labetalol 10 mg in the ER.  Repeat blood pressure 134/80.  The patient's CT head did not show any acute changes, so we will monitor on telemetry today.  2.  For hypertension, continue Lopressor 50  mg every 12 hours and use hydralazine as needed 20 mg IV.  3.  Elevated troponin, which is very slight, likely due to demand ischemia. Continue to cycle two more sets of troponin, continue aspirin, beta blockers.  4.  Severe dementia: The patient is on Namenda and also Aricept.  At this time, we can continue all that and use Ativan or Haldol as needed for agitation.  5.  Hypoglycemia: Has no history of diabetes. The patient is given an amp of D50 which increased her sugars. At this time, the patient's sugars are improved and watch the blood glucose every 4 hours and continue D5 half at 60 mL an hour.   Other diagnosES: Included:  1.  Advanced dementia, as mentioned.   2.  Chronic kidney disease stage III, hold any nephrotoxic agents.  3.  Constipation: Use milk of magnesia and also senna as needed for constipation.  4.  The patient looks like she has chronic anemia. Watch hemoglobin closely.  We will start her on dysphagia diet with aspiration precautions and get a speech therapy evaluation tomorrow. 5.  CODE STATUS: DO NOT RESUSCITATE.   Time Spent ON history and physical: About 55 minutes.   ____________________________ Katha HammingSnehalatha Solomon Skowronek, MD sk:th D: 04/27/2012 12:28:39 ET T: 04/27/2012 22:46:51 ET JOB#: [id number]  cc: Katha HammingSnehalatha Adamaris King, MD, <Dictator> Katha HammingSNEHALATHA Lindalou Soltis MD ELECTRONICALLY SIGNED 05/20/2012 22:19

## 2014-07-16 NOTE — H&P (Signed)
PATIENT NAME:  Heather Burgess, Heather Burgess MR#:  [id number] DATE OF BIRTH:  06/03/1924  DATE OF ADMISSION:  03/07/2013  PRIMARY CARE PHYSICIAN: Dr. Stefani DamaMary B. McGranaghan.   CHIEF COMPLAINT: Altered mental status and fever.   HISTORY OF PRESENT ILLNESS: This is an 81 year old female who presents to the hospital from [facility] due to fever of 102 and altered mental status. The patient herself has baseline dementia, therefore most of the history obtained from the chart and from the ER physician. The patient was sent over to the hospital today as she had a fever of 102 and she was more lethargic and not her usual self. The patient in the Emergency Room was noted to be hypotensive, systolic blood pressures in the 80s and continued to have a fever of 102. She was also tachycardic. She was noted to be in acute renal failure and noted to have a urinary tract infection. Hospitalist services were contacted for further treatment and evaluation.   REVIEW OF SYSTEMS: Is otherwise unobtainable given the patient's mental status.   PAST MEDICAL HISTORY: Consistent with hypertension, GERD and dementia.   ALLERGIES: No known drug allergies.   SOCIAL HISTORY: Unobtainable given the patient's mental status.   FAMILY HISTORY: Also unobtainable.   CURRENT MEDICATIONS: As follows: Milk of magnesia 30 mL daily as needed, Bactrim double strength started December on 11 b.i.d., aspirin 81 mg daily, omeprazole 20 mg daily, Ensure t.i.d. with meals, Tylenol 500 mg b.i.d., metoprolol tartrate 25 mg b.i.d., Namenda 10 mg b.i.d., Aricept 10 mg daily, Remeron 7.5 mg at bedtime, Senokot 2 tabs at bedtime.   PHYSICAL EXAMINATION:  VITAL SIGNS:  Presently is as follows: Temperature is 102.9, pulse 97, respirations 18, blood pressure 96/55, sats 96% on room air.  GENERAL: She is a lethargic but pleasant-appearing female in no apparent distress.  HEENT: Atraumatic, normocephalic. Extraocular muscles are intact. Her pupils are equal and  reactive to light. Sclerae anicteric. No conjunctival injection. No pharyngeal erythema. Oral mucosa is dry.  NECK: Supple. There is no jugular venous distention, no bruits, no lymphadenopathy, no thyromegaly.  HEART: Regular rate and rhythm. No murmurs. No rubs, no clicks.  LUNGS: Clear to auscultation anteriorly. No rales, rhonchi, no wheezes. No dullness to percussion. Negative use of accessory muscles.  ABDOMEN: Soft, flat, nontender, nondistended. Has good bowel sounds. No hepatosplenomegaly appreciated.  EXTREMITIES: No evidence of any cyanosis, clubbing, or peripheral edema. Has +2 pedal and radial pulses bilaterally.  NEUROLOGIC: The patient is alert, awake, and oriented x 1. She has baseline dementia. Difficult to do a full neurological exam. Moves all extremities spontaneously, globally weak.  SKIN: Moist and warm with no rashes.  LYMPHATIC: There is no cervical or axillary lymphadenopathy.   LABORATORY DATA: Serum glucose 110, BUN 35, creatinine 2.2, sodium 138, potassium 4.9, chloride 109, bicarbonate 24. The patient's LFTs within normal limits. White cell count 14.9, hemoglobin 10.7, hematocrit 34.5, platelet count is 262. Urinalysis shows 10 white cells with negative nitrites and leukocyte esterase. The patient did have a chest x-ray done which showed mild right basilar atelectasis.   ASSESSMENT AND PLAN: This is an 81 year old female with a history of dementia, hypertension, gastroesophageal reflux disease who presents to the hospital due to fever of 102 and likely systemic inflammatory response syndrome secondary to urinary tract infection.   PROBLEMS:  1.  Systemic inflammatory response syndrome: The patient presented with fever, tachycardia, hypotension, and also leukocytosis. The likely source for the systemic inflammatory response syndrome is probably urinary tract  infection, although urinalysis is not truly positive. Chest x-ray is negative for any acute pneumonia. The patient was  apparently started on Bactrim as an outpatient for possible urinary tract infection, but has not responded well. For now, I will give the patient IV fluids, start the patient on IV ceftriaxone, follow blood and urine cultures, follow hemodynamics.  2.  Acute renal failure. This is likely in the setting of the hypotension, systemic inflammatory response syndrome and also underlying urinary tract infection. I will give her IV fluids, follow BUN and creatinine, urine output. Renal dose meds. Avoid nephrotoxins.  3.  Urinary tract infection, likely cause of her systemic inflammatory response syndrome. I will treat her with IV ceftriaxone and follow urine cultures.  4.  Gastroesophageal reflux disease. Continue omeprazole.  5.  Dementia. Continue with the Namenda and Aricept.  6.  Hypertension. Hold the metoprolol given relative hypotension and systemic inflammatory response syndrome  7. Septic Shock - pt. is hypotensive despite getting aggressive IV fluid resuscitation.  Will start on some vasopressors with Dopamine.  Keep MAP greater than 60 and follow hemodynamics.  Continue IV fluids and Cont. IV Ceftriaxone.     CODE STATUS: The patient is a DO NOT INTUBATE/DO NOT RESUSCITATE.   Critical Care TIME SPENT ON ADMISSION: 50 minutes    ____________________________ Rolly Pancake. Cherlynn Kaiser, MD vjs:dp D: 03/07/2013 09:51:22 ET T: 03/07/2013 10:16:19 ET JOB#: [id number]  cc: Rolly Pancake. Cherlynn Kaiser, MD, <Dictator> [place] Siren MD ELECTRONICALLY SIGNED 03/07/2013 15:09

## 2014-07-16 NOTE — Discharge Summary (Signed)
PATIENT NAME:  Heather Burgess, Glendine MR#:  [id number] DATE OF BIRTH:  03-02-1925  DATE OF ADMISSION:  04/27/2012 DATE OF DISCHARGE:  04/27/2012  [facility].   CODE STATUS: DNR.   PRIMARY CARE PHYSICIAN: [facility].  HOSPITAL COURSE:  1. This is an 81 year old female with severe dementia brought [facility] because of fall. The patient has severe advanced dementia. She was brought in because of fall and mental status. Look at the history and physical for full details. Blood sugar was around 55 when she came in. Systolic blood pressure was in 200s. The patient was admitted to the hospitalist service for altered mental status due to fall and also possible hypertensive encephalopathy. The patient received one dose of labetalol 10 mg IV in the ER. That brought her blood pressure down to 140/80 and the patient's CT of the head did not show any acute changes. She also had cervical spine CT which did not reveal any acute abnormalities. The patient was admitted for monitoring of her blood pressure. The patient's blood pressure has been fine since admission. We continued her on her blood pressure medication that she takes which is metoprolol 50 mg q. 12 hours and also monitored her on telemetry. The patient's blood pressure has been fine.  2. Altered mental status likely because of her serve underlying dementia causing her fall. We do not see any other reason. Her blood cultures and urine cultures have been negative. She is afebrile. The patient's white count also is normal. She was started on Rocephin and Zithromax on admission because of possible pneumonia. Her chest x-ray showed bilateral diffuse interstitial thickening representing interstitial edema versus interstitial pneumonitis so she did receive Rocephin and Zithromax. She will be [facility] with Zithromax for 3 days. The patient can finish that. She is not hypoxic anymore.  3. Severe  dementia. Continue Namenda and Aricept.  4. For constipation she can continue her stool softeners. 5. For agitation she can use Ativan 0.5 mg every 6 hours. 6. The patient had hypoglycemia, received 1 amp of D50 and did not have to have any further doses and sugars have been within normal range around 89 to 90.  7. The patient's initial troponin was slightly up at 0.06 and repeated one is also 0.05. EKG did not show any acute changes.   The patient can be discharged once [facility] staff sees the patient.   TIME SPENT: More than 30 minutes. ____________________________ Katha HammingSnehalatha Grisel Blumenstock, MD sk:sb D: 04/28/2012 12:45:29 ET T: 04/28/2012 13:51:47 ET JOB#: [id number]  cc: Katha HammingSnehalatha Alveria Mcglaughlin, MD, <Dictator> Katha HammingSNEHALATHA Dontez Hauss MD ELECTRONICALLY SIGNED 05/12/2012 22:34

## 2014-07-17 NOTE — H&P (Signed)
PATIENT NAME:  Heather Burgess, Heather Burgess MR#:  [id number] DATE OF BIRTH:  21-Jul-1924  DATE OF ADMISSION:  04/23/2013  PRIMARY CARE PHYSICIAN: [facility].   The patient is an 81 year old African American female with past medical history significant for history of admission 02/2013 for systemic inflammatory response reaction due to urinary tract infection. Comes back again from [facility] when she was found to be less responsive than normal. She was not able to answer questions appropriately.  She was weak, and not able to ambulate the way she usually does. On arrival to the Emergency Room, she was somewhat alert, however, does not respond to questions appropriately but was able to answer some questions, but usually she would answer "I do not know".  She was recently initiated on Septra DS and the last dose she was given on the 28th of 03/2013 in the morning. Most of the questions answered "I don't know" at this time and I  am not able to get any review of systems.   PAST MEDICAL HISTORY: Significant for history of admission in 02/2013 for SIRS due to urinary tract infection. Also history of severe dementia, hypertension, gastroesophageal reflux disease, osteoarthritis, depression, history of breast cancer, diverticulitis.   MEDICATIONS: The patient's medication list is as follows: 1.  Ammonium lactate topical cream 12% to both feet once daily at 7:00 p.m.  2. Aspirin 81 mg p.o. daily. 3. Bisac/evac 10 mg rectal suppository once daily as needed.  4. Donepezil 10 mg p.o. daily at 7:00 p.m.  5. Tylenol 500 mg p.o. twice daily at 8:00 a.m. as well as 7:00 p.m.   6. Metoprolol tartrate 25 mg twice daily, at 8:00 p.m. and 7:00 p.m.  7. Milk of magnesia 8% oral suspension 30 mL once daily as needed.  8. Remeron 7.5 mg p.o. at bedtime.  9. Mucinex DM 30/600, 1 tablet twice daily.  10. Namenda 10 mg twice daily.  11. Omeprazole 10 mg p.o. daily.  12. Senna lax 8.6 mg 2 tablets once at bedtime.   13. Sulfamethoxazole trimethoprim 400/80, 1 tablet once a day.   SOCIAL HISTORY: Not obtainable due to the patient's mental status.   FAMILY HISTORY: Not obtainable.   REVIEW OF SYSTEMS: Not obtainable as the patient is demented.   SOCIAL HISTORY: The patient lives at [facility]. No family members are available at this time.   CODE STATUS: DO NOT RESUSCITATE   PHYSICAL EXAMINATION:  VITAL SIGNS: On arrival to the Emergency Room, the patient's temperature is 97.3. Pulse was 52, respirations 18, blood pressure 148/86, and saturation was 97% on room air.  GENERAL: This is well-developed, well-nourished African American female in no significant distress, lying on the stretcher. HEENT: Pupils equal, reactive to light. Extraocular muscles intact. No icterus, or conjunctivitis. Has normal hearing. No pharyngeal erythema. Mucosa is dry.  NECK: No masses. Supple, nontender. Thyroid is not enlarged. No adenopathy. No JVD or carotid bruits bilaterally. Full range of motion.  LUNGS: Clear to auscultation in all fields anteriorly. No significantly diminished breath sounds or wheezing. No rales, rhonchi, or labored inspirations. No increased effort. No dullness to percussion. Not in overt respiratory distress.  CARDIOVASCULAR: S1, S2 appreciated. No murmurs, gallops or rubs were noted. Rhythm was regular. PMI not lateralized. Chest is nontender to palpation.  EXTREMITIES: 1+ pedal pulses. No lower extremity edema, calf tenderness or cyanosis was noted.  ABDOMEN: Soft, minimally tender in suprapubic area. No masses. No hepatosplenomegaly or other abnormalities were found. Bowel sounds are present.  RECTAL: Deferred.  MUSCLE STRENGTH: Able to move all extremities. No cyanosis, degenerative joint disease. Not able to assess for kyphosis. Gait is not tested.  SKIN: Did not reveal any rashes, lesions, erythema, nodularity or induration. It was warm and dry to palpation.  LYMPHATIC: No adenopathy in the  cervical region.  NEUROLOGIC: Cranial nerves grossly intact. Sensory intact. No dysarthria or aphasia. The patient is alert, not cooperative. Memory is severely impaired. The patient is confused, but no agitation or depression noted. She is not able to tell me where she is located at present,   LABORATORY DATA: BMP revealed BUN and creatinine of 26 and 1.72,  bicarbonate of 20, otherwise BMP was unremarkable. Albumin level was 3.3. Troponin was 0.06 on the first set, 0.07 on the second set. Her white cell count was normal at 7.9. Hemoglobin was 11.2, platelet count was 243.   Urinalysis: Yellow hazy urine, negative for glucose, bilirubin or ketones. Specific gravity was 1.026. pH was 5.0, negative for blood, 30 mg/dL protein, negative for nitrites, 2+ leukocyte esterase, 2 red blood cells, 22 white blood cells, trace bacteria, 8 epithelial cells, mucus was present, as well as hyaline cast.   EKG showed sinus brady at 52 beats per minute, left axis deviation, incomplete right bundle branch block, possible lateral infarct, age undetermined  , but no acute ST-T changes were noted in comparison to prior EKG done April 2014. Questionable lateral infarct with poor R wave progression in V4, V6 are new since April 2014.   ASSESSMENT AND PLAN: 1. Metabolic encephalopathy. Likely urinary tract infection  related. 2. Urinary tract infection. Initiate the patient on Rocephin after cultures were taken.  3. Elevated troponin. Continue metoprolol, as well as aspirin, as well as heparin subcutaneous and follow cardiac enzymes x 3. Get echocardiogram since the EKG seems to be different from most recent in  April 2014.  4. Acute on chronic renal failure. Continue the patient on IV fluids. We will reassess the patient's creatinine in the morning.  5. Dehydration. Continue IV fluids as above.  6. History of hypertension. Continue metoprolol. The patient is relatively bradycardic, we may need to decrease metoprolol dose  and change her medications. I think something else to help her with her bradycardia.  7. Anemia. Get guaiac.   TIME SPENT: 50 minutes.   ____________________________ Katharina Caperima Elivia Robotham, MD rv:sg D: 04/23/2013 11:37:44 ET T: 04/23/2013 12:58:27 ET JOB#: [id number]  cc: Katharina Caperima Yamil Dougher, MD, <Dictator> [facility]  Trixy Loyola MD ELECTRONICALLY SIGNED 05/26/2013 20:30

## 2014-07-17 NOTE — Discharge Summary (Signed)
PATIENT NAME:  Heather Burgess, Heather MR#:  [id number] DATE OF BIRTH:  1925-01-30  DATE OF ADMISSION:  03/07/2013 DATE OF DISCHARGE:  03/10/2013  PRESENTING COMPLAINT: Altered mental status and fever.   DISCHARGE DIAGNOSES: 1.  Sepsis secondary to suspected urinary tract infection, resolved.  2.  Chronic dementia.  3.  Urinary tract infection.   CODE STATUS: NO CODE, DNR.   DISPOSITION: The patient is being [facility] to [facility] with [facility] health PT.   DISCHARGE MEDICATIONS: 1.  Aspirin 81 mg p.o. daily.  2.  MAPAP 500 mg 1 tablet b.i.d.  3.  Senna laxative 8.6 mg 2 tablets once a day.  4.  Bisacodyl 10 mg suppository as needed.  5.  Milk of magnesia 30 mL as needed for constipation.  6.  Ensure chocolate twice a day.  7.  Remeron 7.5 mg at bedtime.  8.  Donepezil 10 mg p.o. daily.  9.  Omeprazole 20 mg daily.  10.  Metoprolol 25 mg b.i.d.  11.  Mucinex DM 30/600 one tablet b.i.d.  12.  Namenda 10 mg b.i.d.  13.  Keflex 250 mg 1 tablet every 8 hours.   DISCHARGE ACTIVITY: Physical therapy.   DISCHARGE DIET: Strict aspiration precautions with pureed diet with thin liquids. No straws. Aspiration precautions.   DISCHARGE INSTRUCTIONS: Follow up with [facility].   LABORATORY AND DIAGNOSTICS: Blood cultures negative.   Urine culture negative.   Glucose 80, BUN 16, Creatinine 1.3, sodium 139, potassium 3.9, chloride 109, bicarb 24, calcium 9.4. White count is 9.4. H and H 10.7 and 33.7.   Chest x-ray: No acute cardiopulmonary abnormality.   UA positive for UTI. White count on admission was 14.9.   BRIEF SUMMARY OF HOSPITAL COURSE:  1.  Heather Burgess is a pleasant 81 year old Caucasian female with past medical history of chronic dementia and gastroesophageal reflux who presents to the Emergency Room with fever of 102, was admitted with septic shock. She was admitted to the intensive care unit, received IV fluids, was on dopamine; that was weaned off. Her septic  shock resolved. She was started on IV Rocephin for possible UTI as the source of her sepsis and fever. Blood cultures and urine cultures were negative. She will finish up a course with Keflex as outpatient. Chest x-ray did not show any pneumonia.  2.  Acute renal failure in the setting of hypotension and septic shock with underlying dehydration and UTI. Received IV fluids. The patient's creatinine improved from 2.12 to 1.31. At baseline her creatinine is 1.1. She appeared euvolemic prior to discharge.  3.  GERD. Continued omeprazole. 4.  Dementia. Continued Namenda and Aricept along with Remeron.  5.  Hypertension. Her home meds were resumed.  Physical therapy [name]. The [name] health PT. Given her chronic dementia, she is not a candidate for intensive inpatient rehab given her inability to retain information given in PT. This was discussed with the patient's daughter, [name], and she voiced understanding. She is okay with the patient returning back [name] with [facility] health PT.   TIME SPENT: 40 minutes.   ____________________________ Wylie HailSona A. Allena KatzPatel, MD sap:sb D: 03/12/2013 11:51:11 ET T: 03/12/2013 12:12:39 ET JOB#: [id number]  cc: Sultan Pargas A. Allena KatzPatel, MD, <Dictator> [facility] Willow OraSONA A Beyonce Sawatzky MD ELECTRONICALLY SIGNED 04/19/2013 8:38

## 2014-07-17 NOTE — Discharge Summary (Signed)
PATIENT NAME:  Heather Burgess, Heather Burgess MR#:  [id number] DATE OF BIRTH:  1924/04/29  DATE OF ADMISSION:  04/23/2013 DATE OF DISCHARGE:  04/27/2013  For a detailed note, please check the history and physical done on admission by Dr. Winona Legato.   DIAGNOSES AT DISCHARGE: 1.  Altered mental status  secondary to metabolic encephalopathy due to urinary tract infection with underlying dementia. 2.  Urinary tract infection.  3.  Dementia.  4.  Hypertension.  5.  Anxiety.  6.  Gastroesophageal reflux disease.    DIET: The patient is being discharged on a low-sodium regular diet.   ACTIVITY: As tolerated.   FOLLOWUP: With the primary care physician at [facility].   DISCHARGE MEDICATIONS: Aspirin 81 mg daily, Tylenol 500 mg b.i.d. as needed, Senokot 2 tabs daily, Dulcolax suppository as needed, milk of magnesia as needed, Remeron 7.5 mg at bedtime, Aricept 10 mg daily, omeprazole 20 mg daily, metoprolol tartrate 25 mg b.i.d., Mucinex b.i.d., Namenda 10 mg b.i.d., Ceftin 12 mg b.i.d. x3 days.   PERTINENT STUDIES DONE DURING THE HOSPITAL COURSE: A CT of the head done without contrast on admission, showing bilateral basal ganglia calcifications, no acute abnormality, cerebrovascular disease, mucosal thickening in the left ethmoidal sinuses. An ultrasound of the kidneys showing bilateral atrophic and echogenic kidney, suggestive of chronic medical renal disease. No hydronephrosis. A chest x-ray done on admission showing no acute abnormalities. An echocardiogram of the heart done, showing ejection fraction of 55% to 60%, mild LVH, mild mitral valve regurgitation, mild to moderate aortic valve sclerosis without evidence of aortic stenosis, mild tricuspid regurgitation.   HOSPITAL COURSE: This is an 81 year old female with medical problems as mentioned above, presented to the hospital on April 23, 2013, secondary to confusion and altered mental status.   1.  Altered mental status. The most likely cause of this  was metabolic encephalopathy secondary to UTI with underlying severe dementia. The patient was empirically started on IV ceftriaxone for her UTI. After getting IV antibiotics, the patient's mental status has improved, although she continues to have periods of agitation and confusion, which, as per the family, is her baseline when she is not in her own surroundings. She currently is being discharged back to [facility], with close follow-up of her mental status there. As per the family, if she were to not improve there, then they would consider placing her in a dementia unit.   2.  Urinary tract infection. This was based on urinalysis. Her urine cultures, although grew out mixed organisms. She was maintained on IV ceftriaxone here for a few days. I am discharging her on oral Ceftin for 3 more days of treatment for her UTI.   3.  Elevated troponin. This was likely supply-demand ischemia. She had no chest pain. She underwent an echocardiogram, which showed normal LV function with no wall motion abnormalities. She will continue aspirin, metoprolol as stated.   4.  Acute on chronic renal failure. This was likely secondary to dehydration. The patient was gently hydrated with IV fluids, and her creatinine currently is at baseline.   5.  Hypertension. The patient's blood pressures remained fairly stable in the hospital on her metoprolol. She will continue that.   6.  Anemia of chronic disease. The patient's hemoglobin remained stable and can further be followed as an outpatient.   7.  Agitation. The patient was maintained on some p.r.n. Ativan and Klonopin, and she will continue her Klonopin at the assisted living.   I did have an extensive  discussion with the patient's health care power of attorney, Heather Burgess, who lives in [place]. I expressed to her about my concern about putting her in a dementia unit, given her significant episodes of agitation and confusion, but, as per the family member, they  would prefer her to go back to [facility], as it seems to improve her symptoms when she is in her familiar surroundings. At this point, the family wants to try her at [facility] for a week or so to see if her mental status improves. If not they would consider putting her in a dementia unit as an outpatient, if her symptoms do not improve. The patient presently is a DO NOT RESUSCITATE.   TIME FOR DISCHARGE: 40 minutes.    ____________________________ Rolly PancakeVivek J. Cherlynn KaiserSainani, MD vjs:cg D: 04/27/2013 17:14:33 ET T: 04/28/2013 06:37:55 ET JOB#: [id number]  cc: Rolly PancakeVivek J. Cherlynn KaiserSainani, MD, <Dictator> [place] SirenVIVEK J Launi Asencio MD ELECTRONICALLY SIGNED 04/30/2013 11:50
